# Patient Record
Sex: Female | Born: 1990
Health system: Southern US, Community
[De-identification: ages and names within clinical notes are randomized; demographics above are authoritative.]

---
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
## 2010-04-22 LAB — URINALYSIS, ROUTINE W REFLEX MICROSCOPIC
Glucose, UA: NEGATIVE mg/dL
Hgb urine dipstick: NEGATIVE
Ketones, ur: NEGATIVE mg/dL
Protein, ur: NEGATIVE mg/dL

## 2010-08-09 ENCOUNTER — Encounter: Payer: Self-pay | Admitting: *Deleted

## 2010-08-09 ENCOUNTER — Emergency Department (HOSPITAL_BASED_OUTPATIENT_CLINIC_OR_DEPARTMENT_OTHER)
Admission: EM | Admit: 2010-08-09 | Discharge: 2010-08-09 | Disposition: A | Discharge status: Home / Self Care | Payer: Medicaid Other | Attending: Emergency Medicine | Admitting: Emergency Medicine

## 2010-08-09 DIAGNOSIS — F17200 Nicotine dependence, unspecified, uncomplicated: Secondary | ICD-10-CM | POA: Insufficient documentation

## 2010-08-09 DIAGNOSIS — J029 Acute pharyngitis, unspecified: Secondary | ICD-10-CM | POA: Insufficient documentation

## 2010-08-09 NOTE — ED Notes (Signed)
Patient C/O throat and tongue for the past two days, hurts to swallow, chills at night

## 2010-08-09 NOTE — ED Provider Notes (Signed)
History     Chief Complaint  Patient presents with  . Sore Throat   Patient is a 20 y.o. female presenting with pharyngitis. The history is provided by the patient.  Sore Throat This is a new problem. The current episode started 2 days ago. The problem occurs constantly. The problem has not changed since onset.Pertinent negatives include no chest pain, no abdominal pain, no headaches and no shortness of breath. The symptoms are aggravated by swallowing. The symptoms are relieved by nothing. She has tried nothing for the symptoms.    History reviewed. No pertinent past medical history.  History reviewed. No pertinent past surgical history.  History reviewed. No pertinent family history.  History  Substance Use Topics  . Smoking status: Current Everyday Smoker  . Smokeless tobacco: Not on file  . Alcohol Use: Yes    OB History    Grav Para Term Preterm Abortions TAB SAB Ect Mult Living                  Review of Systems  Constitutional: Negative for fever and fatigue.  HENT: Positive for postnasal drip. Negative for congestion, rhinorrhea, neck pain, neck stiffness, sinus pressure and ear discharge.   Eyes: Negative.   Respiratory: Negative.  Negative for shortness of breath.   Cardiovascular: Negative.  Negative for chest pain.  Gastrointestinal: Negative.  Negative for abdominal pain.  Genitourinary: Negative.   Musculoskeletal: Negative.   Skin: Negative.   Neurological: Negative for headaches.    Physical Exam  BP 118/61  Pulse 65  Temp(Src) 98.5 F (36.9 C) (Oral)  Resp 18  SpO2 100%  Physical Exam  Constitutional: She is oriented to person, place, and time. She appears well-developed and well-nourished.  HENT:  Head: Normocephalic.  Right Ear: External ear normal.  Left Ear: External ear normal.  Nose: Nose normal.  Mouth/Throat: Oropharynx is clear and moist. No oropharyngeal exudate.  Eyes: Pupils are equal, round, and reactive to light.  Neck: No  tracheal deviation present.  Cardiovascular: Normal rate, regular rhythm and normal heart sounds.   Pulmonary/Chest: Effort normal and breath sounds normal.  Abdominal: Soft. Bowel sounds are normal.  Musculoskeletal: Normal range of motion.  Lymphadenopathy:    She has cervical adenopathy.  Neurological: She is alert and oriented to person, place, and time.  Skin: Skin is warm and dry.    ED Course  Procedures Results for orders placed during the hospital encounter of 08/09/10  RAPID STREP SCREEN      Component Value Range   Streptococcus, Group A Screen (Direct) NEGATIVE  NEGATIVE     MDM  Symptoms likely viral, will discuss symptomatic tx, f/u with pmd if no better      Rolan Bucco, MD 08/09/10 1259

## 2012-08-22 ENCOUNTER — Encounter (HOSPITAL_BASED_OUTPATIENT_CLINIC_OR_DEPARTMENT_OTHER): Payer: Self-pay

## 2012-08-22 ENCOUNTER — Emergency Department (HOSPITAL_BASED_OUTPATIENT_CLINIC_OR_DEPARTMENT_OTHER)
Admission: EM | Admit: 2012-08-22 | Discharge: 2012-08-22 | Disposition: A | Discharge status: Home / Self Care | Payer: Medicaid Other | Attending: Emergency Medicine | Admitting: Emergency Medicine

## 2012-08-22 DIAGNOSIS — B9689 Other specified bacterial agents as the cause of diseases classified elsewhere: Secondary | ICD-10-CM | POA: Insufficient documentation

## 2012-08-22 DIAGNOSIS — F17200 Nicotine dependence, unspecified, uncomplicated: Secondary | ICD-10-CM | POA: Insufficient documentation

## 2012-08-22 DIAGNOSIS — R319 Hematuria, unspecified: Secondary | ICD-10-CM | POA: Insufficient documentation

## 2012-08-22 DIAGNOSIS — N760 Acute vaginitis: Secondary | ICD-10-CM

## 2012-08-22 DIAGNOSIS — Z3202 Encounter for pregnancy test, result negative: Secondary | ICD-10-CM | POA: Insufficient documentation

## 2012-08-22 DIAGNOSIS — A499 Bacterial infection, unspecified: Secondary | ICD-10-CM | POA: Insufficient documentation

## 2012-08-22 DIAGNOSIS — N390 Urinary tract infection, site not specified: Secondary | ICD-10-CM

## 2012-08-22 DIAGNOSIS — R21 Rash and other nonspecific skin eruption: Secondary | ICD-10-CM | POA: Insufficient documentation

## 2012-08-22 LAB — GC/CHLAMYDIA PROBE AMP: CT Probe RNA: NEGATIVE

## 2012-08-22 LAB — WET PREP, GENITAL
Trich, Wet Prep: NONE SEEN
Yeast Wet Prep HPF POC: NONE SEEN

## 2012-08-22 LAB — URINALYSIS, ROUTINE W REFLEX MICROSCOPIC
Bilirubin Urine: NEGATIVE
Specific Gravity, Urine: 1.023 (ref 1.005–1.030)
pH: 5 (ref 5.0–8.0)

## 2012-08-22 MED ORDER — NITROFURANTOIN MONOHYD MACRO 100 MG PO CAPS: 100.0000 mg | ORAL_CAPSULE | Freq: Two times a day (BID) | ORAL | Status: AC

## 2012-08-22 MED ORDER — METRONIDAZOLE 500 MG PO TABS: 500.0000 mg | ORAL_TABLET | Freq: Two times a day (BID) | ORAL | Status: DC

## 2012-08-22 NOTE — ED Provider Notes (Addendum)
CSN: [id number]     Arrival date & time 08/22/12  1110 History     First MD Initiated Contact with Patient 08/22/12 1121     Chief Complaint  Patient presents with  . Vaginal Discharge   . Hematuria   (Consider location/radiation/quality/duration/timing/severity/associated sxs/prior Treatment) Patient is a 22 y.o. female presenting with vaginal discharge. The history is provided by the patient.  Vaginal Discharge Quality:  Watery, white, yellow and milky Severity:  Severe Onset quality:  Gradual Duration:  2 weeks Timing:  Constant Progression:  Worsening Chronicity:  New Context: spontaneously   Relieved by:  Nothing Worsened by:  Nothing tried Ineffective treatments:  None tried Associated symptoms: vaginal itching   Associated symptoms: no abdominal pain, no dysuria, no fever, no genital lesions, no nausea and no urinary frequency   Associated symptoms comment:  Pt states she has started to chafe since the d/c started and now some irritation with urinating and occasional blood flecks when wiping.   Risk factors: unprotected sex   Risk factors: no gynecological surgery, no new sexual partner, no PID, no prior miscarriage, no STI and no STI exposure     History reviewed. No pertinent past medical history. History reviewed. No pertinent past surgical history. No family history on file. History  Substance Use Topics  . Smoking status: Current Every Day Smoker  . Smokeless tobacco: Not on file  . Alcohol Use: Yes   OB History   Grav Para Term Preterm Abortions TAB SAB Ect Mult Living                 Review of Systems  Constitutional: Negative for fever.  Gastrointestinal: Negative for nausea and abdominal pain.  Genitourinary: Positive for vaginal discharge. Negative for dysuria.  All other systems reviewed and are negative.    Allergies  Review of patient's allergies indicates no known allergies.  Home Medications  No current outpatient prescriptions on  file. BP 113/66  Pulse 70  Temp(Src) 98.6 F (37 C) (Oral)  Resp 16  Ht 5\' 4"  (1.626 m)  Wt 130 lb (58.968 kg)  BMI 22.3 kg/m2  SpO2 100% Physical Exam  Nursing note and vitals reviewed. Constitutional: She is oriented to person, place, and time. She appears well-developed and well-nourished. No distress.  HENT:  Head: Normocephalic and atraumatic.  Mouth/Throat: Oropharynx is clear and moist.  Eyes: Conjunctivae and EOM are normal. Pupils are equal, round, and reactive to light.  Neck: Normal range of motion. Neck supple.  Cardiovascular: Normal rate and intact distal pulses.   Pulmonary/Chest: Effort normal.  Abdominal: Soft. She exhibits no distension. There is no tenderness. There is no rebound and no guarding.  Genitourinary:    There is rash on the right labia. There is no lesion on the right labia. There is rash on the left labia. There is no lesion on the left labia. Vaginal discharge found.  Musculoskeletal: Normal range of motion. She exhibits no edema and no tenderness.  Neurological: She is alert and oriented to person, place, and time.  Skin: Skin is warm and dry. No rash noted. No erythema.  Psychiatric: She has a normal mood and affect. Her behavior is normal.    ED Course   Procedures (including critical care time)  Labs Reviewed  WET PREP, GENITAL - Abnormal; Notable for the following:    Clue Cells Wet Prep HPF POC FEW (*)    WBC, Wet Prep HPF POC FEW (*)    All other components within  normal limits  URINALYSIS, ROUTINE W REFLEX MICROSCOPIC - Abnormal; Notable for the following:    APPearance CLOUDY (*)    Hgb urine dipstick SMALL (*)    Nitrite POSITIVE (*)    Leukocytes, UA MODERATE (*)    All other components within normal limits  URINE MICROSCOPIC-ADD ON - Abnormal; Notable for the following:    Bacteria, UA MANY (*)    All other components within normal limits  GC/CHLAMYDIA PROBE AMP  PREGNANCY, URINE   No results found. 1. Bacterial  vaginosis   2. UTI (lower urinary tract infection)     MDM   Patient with 2 weeks of worsening vaginal discharge with external vaginal irritation. She denies any dysuria and states she only has one sexual partner unprotected for the last 5 years. She currently has an IUD in last period was approximately several weeks ago. No systemic symptoms. Patient has copious discharge on exam an external excoriation and irritation of the labia. No herpetic lesions present. No findings concerning for PID.  UA, UPT, GC Chlamydia, wet prep pending  12:13 PM Pt with evidence of UTI and BV.  Will treat with flagyl and macrobid.  To f/u with pcp if worsens.  Gwyneth Sprout, MD 08/22/12 1214  Gwyneth Sprout, MD 08/22/12 1215

## 2012-08-22 NOTE — ED Notes (Signed)
Symptoms started 2 weeks with "alot" of while milky non odorous  vaginal discharge, with blood in urine during wiping started yesterday, no dysuria. Had IUD placed 2.70yrs ago. Sexually active with only one partner. ABC intact. VSS.

## 2012-08-24 LAB — URINE CULTURE: Colony Count: >=100000

## 2012-08-26 ENCOUNTER — Telehealth (HOSPITAL_COMMUNITY): Payer: Self-pay | Admitting: Emergency Medicine

## 2012-08-26 NOTE — ED Notes (Signed)
Post ED Visit - Positive Culture Follow-up  Culture report reviewed by antimicrobial stewardship pharmacist: []  Wes Dulaney, Pharm.D., BCPS [x]  Celedonio Miyamoto, Pharm.D., BCPS []  Georgina Pillion, Pharm.D., BCPS []  [place], [address].D., BCPS, AAHIVP []  Estella Husk, Pharm.D., BCPS, AAHIVP  Positive urine culture Treated with Macrobid, organism sensitive to the same and no further patient follow-up is required at this time.  Kylie A Holland 08/26/2012, 11:02 AM

## 2016-09-02 ENCOUNTER — Emergency Department (HOSPITAL_BASED_OUTPATIENT_CLINIC_OR_DEPARTMENT_OTHER)
Admission: EM | Admit: 2016-09-02 | Discharge: 2016-09-02 | Disposition: A | Discharge status: Home / Self Care | Payer: Medicaid Other | Attending: Emergency Medicine | Admitting: Emergency Medicine

## 2016-09-02 ENCOUNTER — Encounter (HOSPITAL_BASED_OUTPATIENT_CLINIC_OR_DEPARTMENT_OTHER): Payer: Self-pay | Admitting: Emergency Medicine

## 2016-09-02 DIAGNOSIS — N760 Acute vaginitis: Secondary | ICD-10-CM | POA: Insufficient documentation

## 2016-09-02 DIAGNOSIS — N390 Urinary tract infection, site not specified: Secondary | ICD-10-CM | POA: Insufficient documentation

## 2016-09-02 DIAGNOSIS — F17210 Nicotine dependence, cigarettes, uncomplicated: Secondary | ICD-10-CM | POA: Insufficient documentation

## 2016-09-02 DIAGNOSIS — B9689 Other specified bacterial agents as the cause of diseases classified elsewhere: Secondary | ICD-10-CM

## 2016-09-02 LAB — URINALYSIS, ROUTINE W REFLEX MICROSCOPIC
BILIRUBIN URINE: NEGATIVE
Glucose, UA: NEGATIVE mg/dL
Hgb urine dipstick: NEGATIVE
KETONES UR: NEGATIVE mg/dL
Nitrite: NEGATIVE
PH: 5.5 (ref 5.0–8.0)
Protein, ur: NEGATIVE mg/dL
SPECIFIC GRAVITY, URINE: 1.022 (ref 1.005–1.030)

## 2016-09-02 LAB — WET PREP, GENITAL
SPERM: NONE SEEN
TRICH WET PREP: NONE SEEN
YEAST WET PREP: NONE SEEN

## 2016-09-02 LAB — PREGNANCY, URINE: PREG TEST UR: NEGATIVE

## 2016-09-02 LAB — URINALYSIS, MICROSCOPIC (REFLEX)

## 2016-09-02 MED ORDER — METRONIDAZOLE 500 MG PO TABS: 500.0000 mg | ORAL_TABLET | Freq: Two times a day (BID) | ORAL | 0 refills | Status: DC

## 2016-09-02 NOTE — ED Triage Notes (Signed)
Patient reports vaginal discharge without odor.  States she also has a foul smell to her urine and believes she has UTI.  Denies dysuria, hematuria.

## 2016-09-02 NOTE — ED Provider Notes (Signed)
[facility]   CSN: [id number] Arrival date & time: 09/02/16  7829     History   Chief Complaint Chief Complaint  Patient presents with  . Vaginal Discharge  . Urinary Tract Infection    HPI Regina Haynes is a 26 y.o. female.  The history is provided by the patient and medical records.  Vaginal Discharge    Urinary Tract Infection       26 year old female with no significant past medical history presenting to the ED with discolored urine and new vaginal discharge. States she feels like her urine has a foul smell. Reports vaginal discharge has been white/clear in color. She denies any pelvic pain, abdominal pain, fever, or rash. No new sexual partner or concern for STD at this time. She denies any dysuria or hematuria. States she has gotten a lot of urinary tract infections in the past.  History reviewed. No pertinent past medical history.  There are no active problems to display for this patient.   History reviewed. No pertinent surgical history.  OB History    No data available       Home Medications    Prior to Admission medications   Medication Sig Start Date End Date Taking? Authorizing Provider  metroNIDAZOLE (FLAGYL) 500 MG tablet Take 1 tablet (500 mg total) by mouth 2 (two) times daily. 08/22/12   Gwyneth Sprout, MD  nitrofurantoin, macrocrystal-monohydrate, (MACROBID) 100 MG capsule Take 1 capsule (100 mg total) by mouth 2 (two) times daily. 08/22/12   Gwyneth Sprout, MD    Family History History reviewed. No pertinent family history.  Social History Social History  Substance Use Topics  . Smoking status: Current Every Day Smoker    Packs/day: 1.00    Types: Cigarettes  . Smokeless tobacco: Never Used  . Alcohol use Yes     Allergies   Patient has no known allergies.   Review of Systems Review of Systems  Genitourinary: Positive for vaginal discharge.  All other systems reviewed and are  negative.    Physical Exam Updated Vital Signs BP 122/85 (BP Location: Right Arm)   Pulse 98   Temp 98.3 F (36.8 C) (Oral)   Resp 18   Ht 5\' 3"  (1.6 m)   Wt 64.4 kg (142 lb)   LMP 08/19/2016 (Approximate)   SpO2 96%   BMI 25.15 kg/m   Physical Exam  Constitutional: She is oriented to person, place, and time. She appears well-developed and well-nourished.  HENT:  Head: Normocephalic and atraumatic.  Mouth/Throat: Oropharynx is clear and moist.  Eyes: Pupils are equal, round, and reactive to light. Conjunctivae and EOM are normal.  Neck: Normal range of motion.  Cardiovascular: Normal rate, regular rhythm and normal heart sounds.   Pulmonary/Chest: Effort normal and breath sounds normal.  Abdominal: Soft. Bowel sounds are normal. There is no tenderness. There is no rigidity, no guarding and no CVA tenderness.  Genitourinary:  Genitourinary Comments: Exam chaperoned by RN Normal female external genitalia without visible lesions or rash, moderate amount of thick, white vaginal discharge present, cervical os is closed, IUD strings are visible, no cervical friability or bleeding, no adnexal or cervical motion tenderness  Musculoskeletal: Normal range of motion.  Neurological: She is alert and oriented to person, place, and time.  Skin: Skin is warm and dry.  Psychiatric: She has a normal mood and affect.  Nursing note and vitals reviewed.    ED Treatments / Results  Labs (all labs ordered are  listed, but only abnormal results are displayed) Labs Reviewed  URINALYSIS, ROUTINE W REFLEX MICROSCOPIC - Abnormal; Notable for the following:       Result Value   APPearance CLOUDY (*)    Leukocytes, UA MODERATE (*)    All other components within normal limits  URINALYSIS, MICROSCOPIC (REFLEX) - Abnormal; Notable for the following:    Bacteria, UA FEW (*)    Squamous Epithelial / LPF 6-30 (*)    All other components within normal limits  WET PREP, GENITAL  PREGNANCY, URINE   GC/CHLAMYDIA PROBE AMP ([place]) NOT AT [facility]    EKG  EKG Interpretation None       Radiology No results found.  Procedures Procedures (including critical care time)  Medications Ordered in ED Medications - No data to display   Initial Impression / Assessment and Plan / ED Course  I have reviewed the triage vital signs and the nursing notes.  Pertinent labs & imaging results that were available during my care of the patient were reviewed by me and considered in my medical decision making (see chart for details).  26 y.o. F here with dark urine and vaginal discharge.  No true urinary symptoms.  Abdominal exam benign, no CVA tenderness.  UA appears contaminated.  Pelvic exam with thick, white vaginal discharge on exam.  No adnexal or CMT.  Wet prep with clue cells noted.  Gc/chl pending.  Will treat with flagyl.  Advised against EtOH consumption while taking this.  Can follow-up with OB-GYN.  Discussed plan with patient, she acknowledged understanding and agreed with plan of care.  Return precautions given for new or worsening symptoms.  Final Clinical Impressions(s) / ED Diagnoses   Final diagnoses:  BV (bacterial vaginosis)    New Prescriptions New Prescriptions   METRONIDAZOLE (FLAGYL) 500 MG TABLET    Take 1 tablet (500 mg total) by mouth 2 (two) times daily.     Garlon Hatchet, PA-C 09/02/16 1110    Melene Plan, DO 09/02/16 1123

## 2016-09-02 NOTE — Discharge Instructions (Signed)
Take the prescribed medication as directed.  Do not drink alcohol while taking this. Follow-up with OB-GYN if you continue having issues. Return to the ED for new or worsening symptoms.

## 2016-09-03 LAB — GC/CHLAMYDIA PROBE AMP (~~LOC~~) NOT AT ARMC
CHLAMYDIA, DNA PROBE: NEGATIVE
NEISSERIA GONORRHEA: NEGATIVE

## 2017-03-14 ENCOUNTER — Emergency Department (HOSPITAL_BASED_OUTPATIENT_CLINIC_OR_DEPARTMENT_OTHER)
Admission: EM | Admit: 2017-03-14 | Discharge: 2017-03-14 | Disposition: A | Discharge status: Home / Self Care | Payer: Self-pay | Attending: Emergency Medicine | Admitting: Emergency Medicine

## 2017-03-14 ENCOUNTER — Other Ambulatory Visit: Payer: Self-pay

## 2017-03-14 DIAGNOSIS — F17210 Nicotine dependence, cigarettes, uncomplicated: Secondary | ICD-10-CM | POA: Insufficient documentation

## 2017-03-14 DIAGNOSIS — N3000 Acute cystitis without hematuria: Secondary | ICD-10-CM | POA: Insufficient documentation

## 2017-03-14 DIAGNOSIS — N760 Acute vaginitis: Secondary | ICD-10-CM | POA: Insufficient documentation

## 2017-03-14 DIAGNOSIS — N898 Other specified noninflammatory disorders of vagina: Secondary | ICD-10-CM

## 2017-03-14 DIAGNOSIS — B9689 Other specified bacterial agents as the cause of diseases classified elsewhere: Secondary | ICD-10-CM | POA: Insufficient documentation

## 2017-03-14 LAB — URINALYSIS, ROUTINE W REFLEX MICROSCOPIC
BILIRUBIN URINE: NEGATIVE
Glucose, UA: NEGATIVE mg/dL
HGB URINE DIPSTICK: NEGATIVE
Ketones, ur: NEGATIVE mg/dL
NITRITE: POSITIVE — AB
PH: 5.5 (ref 5.0–8.0)
Protein, ur: NEGATIVE mg/dL
Specific Gravity, Urine: >1.03 — ABNORMAL HIGH (ref 1.005–1.030)

## 2017-03-14 LAB — WET PREP, GENITAL
Sperm: NONE SEEN
Trich, Wet Prep: NONE SEEN
Yeast Wet Prep HPF POC: NONE SEEN

## 2017-03-14 LAB — URINALYSIS, MICROSCOPIC (REFLEX)

## 2017-03-14 LAB — PREGNANCY, URINE: Preg Test, Ur: NEGATIVE

## 2017-03-14 MED ORDER — METRONIDAZOLE 500 MG PO TABS: 500.0000 mg | ORAL_TABLET | Freq: Two times a day (BID) | ORAL | 0 refills | Status: DC

## 2017-03-14 MED ORDER — CEPHALEXIN 500 MG PO CAPS: 500.0000 mg | ORAL_CAPSULE | Freq: Three times a day (TID) | ORAL | 0 refills | Status: AC

## 2017-03-14 MED FILL — metroNIDAZOLE 500 MG TABS: 500 | 7 days supply | Qty: 14 | Fill #0

## 2017-03-14 MED FILL — CEPHALEXIN 500 MG CAPSULE: 500 | 7 days supply | Qty: 21 | Fill #0

## 2017-03-14 NOTE — ED Triage Notes (Signed)
Pt c/o a cyst on vaginal area and having a discharge

## 2017-03-14 NOTE — ED Provider Notes (Signed)
Emergency Department Provider Note   I have reviewed the triage vital signs and the nursing notes.   HISTORY  Chief Complaint Vaginal Discharge   HPI Regina Haynes is a 27 y.o. female presents to the emergency department for evaluation of mild vaginal discharge with recent ruptured cyst on the outside of the vagina.  The patient has had prior cysts in the past.  She noticed a slightly irregularly-shaped cyst which was unusual 2 days ago.  The cyst ruptured yesterday and produced a white and slightly bloody fluid.  She has 2 additional cysts that she knew about previously that are not significantly bigger.  She denies any fevers or chills.  No specific pain.  She does have history of bacterial vaginosis and is concerned this may be co-occurring.  Denies any dysuria, hesitancy, urgency.   No past medical history on file.  There are no active problems to display for this patient.   No past surgical history on file.  Current Outpatient Rx  . Order #: [id number] Class: Print  . Order #: [id number] Class: Print  . Order #: [id number] Class: Print    Allergies Patient has no known allergies.  No family history on file.  Social History Social History   Tobacco Use  . Smoking status: Current Every Day Smoker    Packs/day: 1.00    Types: Cigarettes  . Smokeless tobacco: Never Used  Substance Use Topics  . Alcohol use: Yes  . Drug use: No    Review of Systems  Constitutional: No fever/chills Eyes: No visual changes. ENT: No sore throat. Cardiovascular: Denies chest pain. Respiratory: Denies shortness of breath. Gastrointestinal: No abdominal pain.  No nausea, no vomiting.  No diarrhea.  No constipation. Genitourinary: Negative for dysuria. Vaginal cysts and mild discharge.  Musculoskeletal: Negative for back pain. Skin: Negative for rash. Neurological: Negative for headaches, focal weakness or numbness.  10-point ROS otherwise  negative.  ____________________________________________   PHYSICAL EXAM:  VITAL SIGNS: ED Triage Vitals  Enc Vitals Group     BP 03/14/17 0833 127/82     Pulse Rate 03/14/17 0833 87     Resp 03/14/17 0833 18     Temp 03/14/17 0833 98 F (36.7 C)     Temp Source 03/14/17 0833 Oral     SpO2 03/14/17 0833 100 %     Weight 03/14/17 0834 130 lb (59 kg)     Height 03/14/17 0834 5\' 4"  (1.626 m)   Constitutional: Alert and oriented. Well appearing and in no acute distress. Eyes: Conjunctivae are normal.  Head: Atraumatic. Nose: No congestion/rhinnorhea. Mouth/Throat: Mucous membranes are moist.   Neck: No stridor.  Cardiovascular:  Good peripheral circulation Respiratory: Normal respiratory effort.  Gastrointestinal:  No distention.  Genitourinary: Pelvic performed with nurse chaperone. Two 0.25 cm cysts on the right labia minora. No cellulitis or fluctuance. Area of tissue just distal to these cysts with no inflammation or drainage. No CMT. No adnexal tenderness or fullness. Mild white vaginal discharge.  Musculoskeletal: No lower extremity tenderness nor edema. No gross deformities of extremities. Neurologic:  Normal speech and language. No gross focal neurologic deficits are appreciated.  Skin:  Skin is warm, dry and intact. No rash noted.  ____________________________________________   LABS (all labs ordered are listed, but only abnormal results are displayed)  Labs Reviewed  WET PREP, GENITAL - Abnormal; Notable for the following components:      Result Value   Clue Cells Wet Prep HPF POC PRESENT (*)  WBC, Wet Prep HPF POC MANY (*)    All other components within normal limits  URINALYSIS, ROUTINE W REFLEX MICROSCOPIC - Abnormal; Notable for the following components:   APPearance CLOUDY (*)    Specific Gravity, Urine >1.030 (*)    Nitrite POSITIVE (*)    Leukocytes, UA MODERATE (*)    All other components within normal limits  URINALYSIS, MICROSCOPIC (REFLEX) -  Abnormal; Notable for the following components:   Bacteria, UA MANY (*)    Squamous Epithelial / LPF 6-30 (*)    All other components within normal limits  PREGNANCY, URINE  GC/CHLAMYDIA PROBE AMP ([place]) [facility]   ____________________________________________  RADIOLOGY  None ____________________________________________   PROCEDURES  Procedure(s) performed:   Procedures  None ____________________________________________   INITIAL IMPRESSION / ASSESSMENT AND PLAN / ED COURSE  Pertinent labs & imaging results that were available during my care of the patient were reviewed by me and considered in my medical decision making (see chart for details).  Patient presents to the emergency department for evaluation of cysts on the vagina.  She has had these in the past.  The 2 remaining cysts are small and are not consistent with abscess.  The cyst distal to this is ruptured with no surrounding cellulitis, fluctuance, or concern for retained fluid or developing abscess.  Pelvic exam is otherwise normal with the exception of some mild white discharge.  Sending for wet prep.  Sending UA and urine pregnancy.  Plan for referral to OB/GYN regarding cyst and capsule removal for definitive treatment. Patient verbalizes understanding of plan.   Wet prep with evidence of BV. UTI on UA. Treating with Flagyl and Keflex. Pregnancy negative. Discussed potential disulfiram reaction to Flagyl. Will f/u with OB regarding cyst mgmt.   At this time, I do not feel there is any life-threatening condition present. I have reviewed and discussed all results (EKG, imaging, lab, urine as appropriate), exam findings with patient. I have reviewed nursing notes and appropriate previous records.  I feel the patient is safe to be discharged home without further emergent workup. Discussed usual and customary return precautions. Patient and family (if present) verbalize understanding and are comfortable with this  plan.  Patient will follow-up with their primary care provider. If they do not have a primary care provider, information for follow-up has been provided to them. All questions have been answered.   ____________________________________________  FINAL CLINICAL IMPRESSION(S) / ED DIAGNOSES  Final diagnoses:  Bacterial vaginosis  Acute cystitis without hematuria  Vaginal discharge    NEW OUTPATIENT MEDICATIONS STARTED DURING THIS VISIT:  Discharge Medication List as of 03/14/2017 10:03 AM    START taking these medications   Details  cephALEXin (KEFLEX) 500 MG capsule Take 1 capsule (500 mg total) by mouth 3 (three) times daily for 7 days., Starting Mon 03/14/2017, Until Mon 03/21/2017, Print        Note:  This document was prepared using Dragon voice recognition software and may include unintentional dictation errors.  Alona BeneJoshua Angeline Trick, MD Emergency Medicine    Khristy Kalan, Arlyss RepressJoshua G, MD 03/14/17 [phone]

## 2017-03-14 NOTE — Discharge Instructions (Signed)

## 2017-03-15 LAB — GC/CHLAMYDIA PROBE AMP (~~LOC~~) NOT AT ARMC
Chlamydia: NEGATIVE
NEISSERIA GONORRHEA: NEGATIVE

## 2017-03-29 ENCOUNTER — Emergency Department (HOSPITAL_BASED_OUTPATIENT_CLINIC_OR_DEPARTMENT_OTHER)
Admission: EM | Admit: 2017-03-29 | Discharge: 2017-03-29 | Disposition: A | Discharge status: Home / Self Care | Payer: Self-pay | Attending: Emergency Medicine | Admitting: Emergency Medicine

## 2017-03-29 ENCOUNTER — Emergency Department (HOSPITAL_BASED_OUTPATIENT_CLINIC_OR_DEPARTMENT_OTHER): Payer: Self-pay

## 2017-03-29 ENCOUNTER — Encounter (HOSPITAL_BASED_OUTPATIENT_CLINIC_OR_DEPARTMENT_OTHER): Payer: Self-pay | Admitting: Emergency Medicine

## 2017-03-29 ENCOUNTER — Other Ambulatory Visit: Payer: Self-pay

## 2017-03-29 DIAGNOSIS — N83202 Unspecified ovarian cyst, left side: Secondary | ICD-10-CM | POA: Insufficient documentation

## 2017-03-29 DIAGNOSIS — N83201 Unspecified ovarian cyst, right side: Secondary | ICD-10-CM | POA: Insufficient documentation

## 2017-03-29 DIAGNOSIS — B9689 Other specified bacterial agents as the cause of diseases classified elsewhere: Secondary | ICD-10-CM | POA: Insufficient documentation

## 2017-03-29 DIAGNOSIS — M5418 Radiculopathy, sacral and sacrococcygeal region: Secondary | ICD-10-CM | POA: Insufficient documentation

## 2017-03-29 DIAGNOSIS — N760 Acute vaginitis: Secondary | ICD-10-CM | POA: Insufficient documentation

## 2017-03-29 DIAGNOSIS — F17210 Nicotine dependence, cigarettes, uncomplicated: Secondary | ICD-10-CM | POA: Insufficient documentation

## 2017-03-29 DIAGNOSIS — B373 Candidiasis of vulva and vagina: Secondary | ICD-10-CM | POA: Insufficient documentation

## 2017-03-29 DIAGNOSIS — B3731 Acute candidiasis of vulva and vagina: Secondary | ICD-10-CM

## 2017-03-29 LAB — PREGNANCY, URINE: PREG TEST UR: NEGATIVE

## 2017-03-29 LAB — WET PREP, GENITAL
SPERM: NONE SEEN
Trich, Wet Prep: NONE SEEN

## 2017-03-29 MED ORDER — METRONIDAZOLE 500 MG PO TABS: 500.0000 mg | ORAL_TABLET | Freq: Two times a day (BID) | ORAL | 0 refills | Status: AC

## 2017-03-29 MED ORDER — FLUCONAZOLE 50 MG PO TABS
150.0000 mg | ORAL_TABLET | Freq: Once | ORAL | Status: AC
  Administered 2017-03-29: 12:00:00 150 mg via ORAL
  Filled 2017-03-29: qty 1

## 2017-03-29 MED ORDER — FLUCONAZOLE 150 MG PO TABS: 150.0000 mg | ORAL_TABLET | Freq: Once | ORAL | 0 refills | Status: AC

## 2017-03-29 MED FILL — metroNIDAZOLE 500 MG TABS: 500 | 7 days supply | Qty: 14 | Fill #0

## 2017-03-29 MED FILL — FLUCONAZOLE 150 MG TABS: 150 | 1 days supply | Qty: 1 | Fill #0

## 2017-03-29 NOTE — ED Notes (Signed)
NAD at this time. Pt is stable and going home.  

## 2017-03-29 NOTE — Discharge Instructions (Signed)
Your wet prep did show signs of bacterial vaginosis.  Have given you an additional medication for this.  Have also given you Diflucan in the ED which is a antifungal medication given that your wet prep did have yeast.  Have also given you additional Diflucan to take in 72 hours if your symptoms do not improve.  I would make sure that you follow-up with an OB/GYN doctor concerning your ovarian cyst.  If you get any fevers, worsening pain, vomiting return the ED for further evaluation sooner.  In terms of your back pain I would useAnti-inflammatories including Motrin or ibuprofen along with heat and ice. Follow-up with a orthopedic doctor if your symptoms do not improve.

## 2017-03-29 NOTE — ED Triage Notes (Signed)
Pt c/o vaginal dc; sts was treated for BV 2 wks ago; sxs cleared up while on abx, but then returned 2 days after completing course

## 2017-03-29 NOTE — ED Provider Notes (Signed)
[facility] EMERGENCY DEPARTMENT Provider Note   CSN: [id number] Arrival date & time: 03/29/17  1007     History   Chief Complaint Chief Complaint  Patient presents with  . Vaginal Discharge    HPI Regina Haynes is a 27 y.o. female.  HPI 27 year old Caucasian female with no pertinent past medical history presents to the emergency department today for evaluation of vaginal discharge.  Patient states that several weeks ago on 3/4 she was treated in the ED for bacterial vaginosis.  She also states that she had a UTI at that time.  She was placed on Flagyl and Keflex.  She states that the discharge improved while she was on antibiotics however 2 days after stopping antibiotics as she had the discharge again.  She reports a white malodorous discharge.  Patient denies any associated pelvic pain, abdominal pain, vaginal itching, nausea, emesis, urinary symptoms, fever.  Patient has not taking for symptoms prior to arrival.  She did look up online that recurring BV can be caused by an IUD.  Patient does not have an OB/GYN doctor that she sees.  Patient also states that she is having some pain in her tailbone region.  States this started 3 weeks ago.  Denies any known injury but states that when she sits down it is very sore.  She has not taken anything for pain.  She has been using a pillow to see that will cushion however this is not helping.  She denies any loss of bowel or bladder, saddle paresthesias, urinary retention, history of IV drug use or cancer.  Again denies any urinary symptoms or flank pain.  Pt denies any fever, chill, ha, vision changes, lightheadedness, dizziness, congestion, neck pain, cp, sob, cough, abd pain, n/v/d, urinary symptoms, change in bowel habits, melena, hematochezia, lower extremity paresthesias.  History reviewed. No pertinent past medical history.  There are no active problems to display for this patient.   History reviewed. No pertinent surgical  history.  OB History    No data available       Home Medications    Prior to Admission medications   Medication Sig Start Date End Date Taking? Authorizing Provider  metroNIDAZOLE (FLAGYL) 500 MG tablet Take 1 tablet (500 mg total) by mouth 2 (two) times daily. 03/14/17   Long, Arlyss RepressJoshua G, MD  nitrofurantoin, macrocrystal-monohydrate, (MACROBID) 100 MG capsule Take 1 capsule (100 mg total) by mouth 2 (two) times daily. 08/22/12   Gwyneth SproutPlunkett, Whitney, MD    Family History No family history on file.  Social History Social History   Tobacco Use  . Smoking status: Current Every Day Smoker    Packs/day: 1.00    Types: Cigarettes  . Smokeless tobacco: Never Used  Substance Use Topics  . Alcohol use: Yes  . Drug use: No     Allergies   Patient has no known allergies.   Review of Systems Review of Systems  All other systems reviewed and are negative.    Physical Exam Updated Vital Signs BP 123/76 (BP Location: Left Arm)   Pulse 91   Temp 98.2 F (36.8 C) (Oral)   Resp 18   Ht 5\' 3"  (1.6 m)   Wt 59 kg (130 lb)   LMP  (LMP Unknown)   SpO2 100%   BMI 23.03 kg/m   Physical Exam  Constitutional: She is oriented to person, place, and time. She appears well-developed and well-nourished.  Non-toxic appearance. No distress.  HENT:  Head: Normocephalic  and atraumatic.  Nose: Nose normal.  Mouth/Throat: Oropharynx is clear and moist.  Eyes: Conjunctivae are normal. Pupils are equal, round, and reactive to light. Right eye exhibits no discharge. Left eye exhibits no discharge.  Neck: Normal range of motion. Neck supple.  Cardiovascular: Normal rate, regular rhythm, normal heart sounds and intact distal pulses.  Pulmonary/Chest: Effort normal and breath sounds normal. No stridor. No respiratory distress. She has no wheezes. She has no rales. She exhibits no tenderness.  Abdominal: Soft. Bowel sounds are normal. There is no tenderness. There is no rigidity, no rebound, no  guarding, no CVA tenderness, no tenderness at McBurney's point and negative Murphy's sign.  Genitourinary:  Genitourinary Comments: Chaperone present for exam. No external lesions, swelling, erythema, or rash of the labia. No erythema, bleeding, or lesions noted in the vaginal vault.  White nonodorous discharge noted.  No CMT tenderness, bleeding or friability. No adnexal tenderness, mass or fullness bilaterally. No inguinal adenopathy or hernia.    Musculoskeletal: Normal range of motion. She exhibits no tenderness.  No midline T spine or L spine tenderness. No deformities or step offs noted. Full ROM. Pelvis is stable.  Lymphadenopathy:    She has no cervical adenopathy.  Neurological: She is alert and oriented to person, place, and time.  Skin: Skin is warm and dry. Capillary refill takes less than 2 seconds.  Psychiatric: Her behavior is normal. Judgment and thought content normal.  Nursing note and vitals reviewed.    ED Treatments / Results  Labs (all labs ordered are listed, but only abnormal results are displayed) Labs Reviewed  WET PREP, GENITAL  PREGNANCY, URINE  GC/CHLAMYDIA PROBE AMP ([place]) NOT AT [facility]    EKG  EKG Interpretation None       Radiology Dg Sacrum/coccyx  Result Date: 03/29/2017 CLINICAL DATA:  Tailbone pain for 2 weeks common no known injury, initial encounter EXAM: SACRUM AND COCCYX - 2+ VIEW COMPARISON:  None. FINDINGS: IUD is noted in place. Sacral ala are within normal limits. Pelvic ring is intact. No acute fracture is noted. IMPRESSION: No acute abnormality noted. Electronically Signed   By: Alcide Clever M.D.   On: 03/29/2017 11:48   US Transvaginal Non-ob  Result Date: 03/29/2017 CLINICAL DATA:  Vaginal discharge. EXAM: TRANSABDOMINAL AND TRANSVAGINAL ULTRASOUND OF PELVIS DOPPLER ULTRASOUND OF OVARIES TECHNIQUE: Study was performed transabdominally to optimize pelvic field of view evaluation and transvaginally to optimize internal visceral  architecture evaluation. Color and duplex Doppler ultrasound was utilized to evaluate blood flow to the ovaries. COMPARISON:  None. FINDINGS: Uterus Measurements: 7.6 x 3.2 x 4.3 cm. No fibroids or other mass visualized. Endometrium Thickness: 2 mm. Intrauterine device is positioned within the endometrium. No focal abnormality visualized. Right ovary Measurements: 5.0 x 3.6 x 4.7 cm. There is a mildly complex cystic area in the right ovary with mild wall thickening in septation measuring 3.8 x 2.6 x 4.1 cm. No other right-sided pelvic lesion evident. Left ovary Measurements: 3.5 x 2.1 x 3.3 cm. There is a somewhat complex mass arising from the left ovary measuring 2.3 x 1.9 x 2.7 cm. No other left-sided pelvic lesion evident. Pulsed Doppler evaluation of both ovaries demonstrates normal low-resistance arterial and venous waveforms. Other findings Small amount of free fluid. IMPRESSION: 1. Mildly complex cystic mass arising from right ovary measuring 3.8 x 2.6 x 4.1 cm. Suspect hemorrhagic ovarian cyst. Short-interval follow up ultrasound in 6-12 weeks is recommended, preferably during the week following the patient's normal menses. 2.  Somewhat complex left ovarian mass measuring 2.3 x 1.9 x 2.7 cm. This lesion also could represent a hemorrhagic ovarian cyst. Endometrioma or possibly focus of infection or differential considerations. This lesion would also benefit from short interval sonographic surveillance. 3.  No apparent torsion on either side. 4.  Small amount of free pelvic fluid. 5. Intrauterine device within the endometrium. Uterus otherwise appears unremarkable. Electronically Signed   By: Bretta Bang III M.D.   On: 03/29/2017 11:55   US Pelvis Complete  Result Date: 03/29/2017 CLINICAL DATA:  Vaginal discharge. EXAM: TRANSABDOMINAL AND TRANSVAGINAL ULTRASOUND OF PELVIS DOPPLER ULTRASOUND OF OVARIES TECHNIQUE: Study was performed transabdominally to optimize pelvic field of view evaluation and  transvaginally to optimize internal visceral architecture evaluation. Color and duplex Doppler ultrasound was utilized to evaluate blood flow to the ovaries. COMPARISON:  None. FINDINGS: Uterus Measurements: 7.6 x 3.2 x 4.3 cm. No fibroids or other mass visualized. Endometrium Thickness: 2 mm. Intrauterine device is positioned within the endometrium. No focal abnormality visualized. Right ovary Measurements: 5.0 x 3.6 x 4.7 cm. There is a mildly complex cystic area in the right ovary with mild wall thickening in septation measuring 3.8 x 2.6 x 4.1 cm. No other right-sided pelvic lesion evident. Left ovary Measurements: 3.5 x 2.1 x 3.3 cm. There is a somewhat complex mass arising from the left ovary measuring 2.3 x 1.9 x 2.7 cm. No other left-sided pelvic lesion evident. Pulsed Doppler evaluation of both ovaries demonstrates normal low-resistance arterial and venous waveforms. Other findings Small amount of free fluid. IMPRESSION: 1. Mildly complex cystic mass arising from right ovary measuring 3.8 x 2.6 x 4.1 cm. Suspect hemorrhagic ovarian cyst. Short-interval follow up ultrasound in 6-12 weeks is recommended, preferably during the week following the patient's normal menses. 2. Somewhat complex left ovarian mass measuring 2.3 x 1.9 x 2.7 cm. This lesion also could represent a hemorrhagic ovarian cyst. Endometrioma or possibly focus of infection or differential considerations. This lesion would also benefit from short interval sonographic surveillance. 3.  No apparent torsion on either side. 4.  Small amount of free pelvic fluid. 5. Intrauterine device within the endometrium. Uterus otherwise appears unremarkable. Electronically Signed   By: Bretta Bang III M.D.   On: 03/29/2017 11:55   [place] Art/ven Flow Abd Pelv Doppler  Result Date: 03/29/2017 CLINICAL DATA:  Vaginal discharge. EXAM: TRANSABDOMINAL AND TRANSVAGINAL ULTRASOUND OF PELVIS DOPPLER ULTRASOUND OF OVARIES TECHNIQUE: Study was performed  transabdominally to optimize pelvic field of view evaluation and transvaginally to optimize internal visceral architecture evaluation. Color and duplex Doppler ultrasound was utilized to evaluate blood flow to the ovaries. COMPARISON:  None. FINDINGS: Uterus Measurements: 7.6 x 3.2 x 4.3 cm. No fibroids or other mass visualized. Endometrium Thickness: 2 mm. Intrauterine device is positioned within the endometrium. No focal abnormality visualized. Right ovary Measurements: 5.0 x 3.6 x 4.7 cm. There is a mildly complex cystic area in the right ovary with mild wall thickening in septation measuring 3.8 x 2.6 x 4.1 cm. No other right-sided pelvic lesion evident. Left ovary Measurements: 3.5 x 2.1 x 3.3 cm. There is a somewhat complex mass arising from the left ovary measuring 2.3 x 1.9 x 2.7 cm. No other left-sided pelvic lesion evident. Pulsed Doppler evaluation of both ovaries demonstrates normal low-resistance arterial and venous waveforms. Other findings Small amount of free fluid. IMPRESSION: 1. Mildly complex cystic mass arising from right ovary measuring 3.8 x 2.6 x 4.1 cm. Suspect hemorrhagic ovarian cyst. Short-interval follow up ultrasound  in 6-12 weeks is recommended, preferably during the week following the patient's normal menses. 2. Somewhat complex left ovarian mass measuring 2.3 x 1.9 x 2.7 cm. This lesion also could represent a hemorrhagic ovarian cyst. Endometrioma or possibly focus of infection or differential considerations. This lesion would also benefit from short interval sonographic surveillance. 3.  No apparent torsion on either side. 4.  Small amount of free pelvic fluid. 5. Intrauterine device within the endometrium. Uterus otherwise appears unremarkable. Electronically Signed   By: Bretta Bang III M.D.   On: 03/29/2017 11:55    Procedures Procedures (including critical care time)  Medications Ordered in ED Medications - No data to display   Initial Impression / Assessment and  Plan / ED Course  I have reviewed the triage vital signs and the nursing notes.  Pertinent labs & imaging results that were available during my care of the patient were reviewed by me and considered in my medical decision making (see chart for details).     Patient presents to the emergency department today for evaluation of painless vaginal discharge.  Treated for BV 2 weeks ago and states that her symptoms improve well antibiotics but then returned.  At that time patient had negative gonorrhea and chlamydia test.  Patient also reports some tailbone pain that is been ongoing for the past month.  No known injury.  Denies any red flag symptoms that be concerning for cauda equina.  She is overall well-appearing and nontoxic.  Vital signs are reassuring.  Patient is afebrile.  Focal abdominal tenderness to palpation.  Pelvic exam reveals some white discharge but no cervical motion tenderness or adnexal tenderness.  Wet prep does reveal yeast and clue cells consistent with her recent antibiotic use.  Patient always has clue cells on her wet prep.  Patient is concerned that her IUD might be causing the bacterial vaginosis.  Will treat with Diflucan and Flagyl.  I have also discussed the patient that she needs to follow-up with an OB/GYN doctor.  From ultrasound to make sure that her IUD was in place.  Ultrasound shows complex ovarian cyst likely representing hemorrhagic cyst on bilateral ovaries.  Differential for the left ovarian cyst includes endometrioma versus focal area of infection.  Patient has no abdominal pain.  No vomiting or fevers.  Patient does report history of bilateral hemorrhagic ovarian cyst.  I do not feel that patient needs antibiotics or further workup for this at this time however I have discussed with her that she needs to follow-up with OB/GYN doctor within the next 1-2 weeks.  Have given her referral.  I discussed that if she develops any fevers, abdominal pain, pelvic pain,  different vaginal discharge return the ED immediately for further evaluation.  Patient had a negative gonorrhea and Chlamydia testing 1 week ago.  Have repeated however do not feel that patient needs treatment at this time.  Low suspicion for STD.  Low suspicion for PID.  Negative pregnancy test.  X-ray of her sacrum and coccyx was obtained that showed no acute abnormality's.  Discussed NSAIDs, ice and follow-up with orthopedics if symptoms persist.  Again no signs or symptoms consistent with cauda equina.  Pt is hemodynamically stable, in NAD, & able to ambulate in the ED. Evaluation does not show pathology that would require ongoing emergent intervention or inpatient treatment. I explained the diagnosis to the patient. Pain has been managed & has no complaints prior to dc. Pt is comfortable with above plan and is stable  for discharge at this time. All questions were answered prior to disposition. Strict return precautions for f/u to the ED were discussed. Encouraged follow up with PCP.   Final Clinical Impressions(s) / ED Diagnoses   Final diagnoses:  BV (bacterial vaginosis)  Vaginal candidiasis  Cysts of both ovaries  Radicular pain of sacrum    ED Discharge Orders        Ordered    metroNIDAZOLE (FLAGYL) 500 MG tablet  2 times daily     03/29/17 1245    fluconazole (DIFLUCAN) 150 MG tablet   Once     03/29/17 1245       Wallace Keller 03/29/17 1251    Cardama, Amadeo Garnet, MD 03/29/17 [phone]

## 2017-03-30 LAB — GC/CHLAMYDIA PROBE AMP (~~LOC~~) NOT AT ARMC
CHLAMYDIA, DNA PROBE: NEGATIVE
Neisseria Gonorrhea: NEGATIVE

## 2018-01-15 ENCOUNTER — Encounter (HOSPITAL_BASED_OUTPATIENT_CLINIC_OR_DEPARTMENT_OTHER): Payer: Self-pay | Admitting: Emergency Medicine

## 2018-01-15 ENCOUNTER — Other Ambulatory Visit: Payer: Self-pay

## 2018-01-15 ENCOUNTER — Emergency Department (HOSPITAL_BASED_OUTPATIENT_CLINIC_OR_DEPARTMENT_OTHER)
Admission: EM | Admit: 2018-01-15 | Discharge: 2018-01-15 | Disposition: A | Discharge status: Home / Self Care | Payer: BLUE CROSS/BLUE SHIELD | Attending: Emergency Medicine | Admitting: Emergency Medicine

## 2018-01-15 DIAGNOSIS — F17210 Nicotine dependence, cigarettes, uncomplicated: Secondary | ICD-10-CM | POA: Diagnosis not present

## 2018-01-15 DIAGNOSIS — N390 Urinary tract infection, site not specified: Secondary | ICD-10-CM

## 2018-01-15 DIAGNOSIS — M549 Dorsalgia, unspecified: Secondary | ICD-10-CM | POA: Diagnosis present

## 2018-01-15 LAB — URINALYSIS, MICROSCOPIC (REFLEX)

## 2018-01-15 LAB — URINALYSIS, ROUTINE W REFLEX MICROSCOPIC
BILIRUBIN URINE: NEGATIVE
Glucose, UA: NEGATIVE mg/dL
KETONES UR: NEGATIVE mg/dL
NITRITE: NEGATIVE
Protein, ur: NEGATIVE mg/dL
Specific Gravity, Urine: 1.025 (ref 1.005–1.030)
pH: 5.5 (ref 5.0–8.0)

## 2018-01-15 LAB — PREGNANCY, URINE: PREG TEST UR: NEGATIVE

## 2018-01-15 MED ORDER — CEPHALEXIN 500 MG PO CAPS: 500.0000 mg | ORAL_CAPSULE | Freq: Two times a day (BID) | ORAL | 0 refills | Status: AC

## 2018-01-15 MED ORDER — KETOROLAC TROMETHAMINE 30 MG/ML IJ SOLN
30.0000 mg | Freq: Once | INTRAMUSCULAR | Status: AC
  Administered 2018-01-15: 30 mg via INTRAVENOUS
  Filled 2018-01-15: qty 1

## 2018-01-15 MED ORDER — SODIUM CHLORIDE 0.9 % IV SOLN
1.0000 g | Freq: Once | INTRAVENOUS | Status: AC
  Administered 2018-01-15: 1 g via INTRAVENOUS
  Filled 2018-01-15: qty 10

## 2018-01-15 MED ORDER — SODIUM CHLORIDE 0.9 % IV BOLUS
500.0000 mL | Freq: Once | INTRAVENOUS | Status: AC
  Administered 2018-01-15: 500 mL via INTRAVENOUS

## 2018-01-15 NOTE — ED Triage Notes (Signed)
Low back pain x 1 week. Denies injury. Endorses urinary frequency.

## 2018-01-15 NOTE — ED Provider Notes (Signed)
[facility] EMERGENCY DEPARTMENT Provider Note   CSN: [id number] Arrival date & time: 01/15/18  1239     History   Chief Complaint Chief Complaint  Patient presents with  . Back Pain    HPI EYMI CORRIVEAU is a 28 y.o. female.  Patient is a 28 year old female who presents with a one-week history of back pain.  She states it started on the right side of her neck but now it is on the left side of her neck and radiates all the way down across her lower back.  She says it is worse with movement.  Occasionally she has some pain to her lower abdomen but denies any now.  She is had some urinary frequency but no other urinary symptoms.  No nausea or vomiting.  No other myalgias.  No significant headache.  No runny nose nasal congestion or coughing.  No rashes.  She reports a fever for the last 2 days but denies any today.     History reviewed. No pertinent past medical history.  There are no active problems to display for this patient.   History reviewed. No pertinent surgical history.   OB History    Gravida  2   Para  2   Term      Preterm      AB      Living  2     SAB      TAB      Ectopic      Multiple      Live Births               Home Medications    Prior to Admission medications   Medication Sig Start Date End Date Taking? Authorizing Provider  cephALEXin (KEFLEX) 500 MG capsule Take 1 capsule (500 mg total) by mouth 2 (two) times daily. 01/15/18   Rolan Bucco, MD  metroNIDAZOLE (FLAGYL) 500 MG tablet Take 1 tablet (500 mg total) by mouth 2 (two) times daily. 03/29/17   Rise Mu, PA-C  nitrofurantoin, macrocrystal-monohydrate, (MACROBID) 100 MG capsule Take 1 capsule (100 mg total) by mouth 2 (two) times daily. 08/22/12   Gwyneth Sprout, MD    Family History No family history on file.  Social History Social History   Tobacco Use  . Smoking status: Current Every Day Smoker    Packs/day: 1.00    Types: Cigarettes  .  Smokeless tobacco: Never Used  Substance Use Topics  . Alcohol use: Yes  . Drug use: No     Allergies   Patient has no known allergies.   Review of Systems Review of Systems  Constitutional: Positive for fatigue and fever. Negative for chills and diaphoresis.  HENT: Negative for congestion, rhinorrhea and sneezing.   Eyes: Negative.   Respiratory: Negative for cough, chest tightness and shortness of breath.   Cardiovascular: Negative for chest pain and leg swelling.  Gastrointestinal: Negative for abdominal pain, blood in stool, diarrhea, nausea and vomiting.  Genitourinary: Negative for difficulty urinating, flank pain, frequency and hematuria.  Musculoskeletal: Positive for back pain and neck pain. Negative for arthralgias.  Skin: Negative for rash.  Neurological: Negative for dizziness, speech difficulty, weakness, numbness and headaches.     Physical Exam Updated Vital Signs BP 121/65 (BP Location: Right Arm)   Pulse (!) 110   Temp 99 F (37.2 C) (Oral)   Resp 20   Ht 5\' 3"  (1.6 m)   Wt 59 kg   LMP 01/07/2018  SpO2 99%   BMI 23.03 kg/m   Physical Exam Constitutional:      Appearance: She is well-developed.  HENT:     Head: Normocephalic and atraumatic.  Eyes:     Pupils: Pupils are equal, round, and reactive to light.  Neck:     Musculoskeletal: Normal range of motion and neck supple.     Comments: No meningismus, patient has some tenderness along the musculature in the left trapezius Cardiovascular:     Rate and Rhythm: Normal rate and regular rhythm.     Heart sounds: Normal heart sounds.  Pulmonary:     Effort: Pulmonary effort is normal. No respiratory distress.     Breath sounds: Normal breath sounds. No wheezing or rales.  Chest:     Chest wall: No tenderness.  Abdominal:     General: Bowel sounds are normal.     Palpations: Abdomen is soft.     Tenderness: There is no abdominal tenderness. There is no guarding or rebound.  Musculoskeletal:  Normal range of motion.     Comments: Mild tenderness in the musculature across the lower back bilaterally, no spinal tenderness  Lymphadenopathy:     Cervical: No cervical adenopathy.  Skin:    General: Skin is warm and dry.     Findings: No rash.  Neurological:     Mental Status: She is alert and oriented to person, place, and time.      ED Treatments / Results  Labs (all labs ordered are listed, but only abnormal results are displayed) Labs Reviewed  URINALYSIS, ROUTINE W REFLEX MICROSCOPIC - Abnormal; Notable for the following components:      Result Value   Hgb urine dipstick MODERATE (*)    Leukocytes, UA TRACE (*)    All other components within normal limits  URINALYSIS, MICROSCOPIC (REFLEX) - Abnormal; Notable for the following components:   Bacteria, UA MANY (*)    All other components within normal limits  URINE CULTURE  PREGNANCY, URINE    EKG None  Radiology No results found.  Procedures Procedures (including critical care time)  Medications Ordered in ED Medications  ketorolac (TORADOL) 30 MG/ML injection 30 mg (30 mg Intravenous Given 01/15/18 1347)  sodium chloride 0.9 % bolus 500 mL (500 mLs Intravenous New Bag/Given 01/15/18 1343)  cefTRIAXone (ROCEPHIN) 1 g in sodium chloride 0.9 % 100 mL IVPB (1 g Intravenous New Bag/Given 01/15/18 1359)     Initial Impression / Assessment and Plan / ED Course  I have reviewed the triage vital signs and the nursing notes.  Pertinent labs & imaging results that were available during my care of the patient were reviewed by me and considered in my medical decision making (see chart for details).     Patient is a 28 year old female who presents with back pain and urinary frequency.  She reported fever yesterday but none today. Her urine looks to be consistent with infection.  She was given IV Rocephin.  Her urine was sent for culture.  She does have some blood in her urine but she does not have symptoms that would  otherwise be more concerning for renal colic.  She does not have unilateral back pain.  There is no associated abdominal pain currently.  Her symptoms do not really seem colicky in nature.  She was discharged home in good condition.  She was encouraged to follow-up with the PCP.  She was advised to return here if she has any worsening symptoms. Final Clinical Impressions(s) /  ED Diagnoses   Final diagnoses:  Lower urinary tract infectious disease    ED Discharge Orders         Ordered    cephALEXin (KEFLEX) 500 MG capsule  2 times daily     01/15/18 1501           Rolan BuccoBelfi, Karolyn Messing, MD 01/15/18 1503

## 2018-01-17 LAB — URINE CULTURE: Culture: <10000 — AB

## 2018-11-20 IMAGING — US US TRANSVAGINAL NON-OB
1 series · 13 of 25 positions shown · non-contrast
Comparison: None.

CLINICAL DATA: Vaginal discharge.

EXAM:
TRANSABDOMINAL AND TRANSVAGINAL ULTRASOUND OF PELVIS
DOPPLER ULTRASOUND OF OVARIES
TECHNIQUE: Study was performed transabdominally to optimize pelvic field of
view evaluation and transvaginally to optimize internal visceral
architecture evaluation. Color and duplex Doppler ultrasound was
utilized to evaluate blood flow to the ovaries.

[Series 1: us transvaginal non-ob · 0.20mm/px · 13 of 149 slices shown]
[im 1/149]
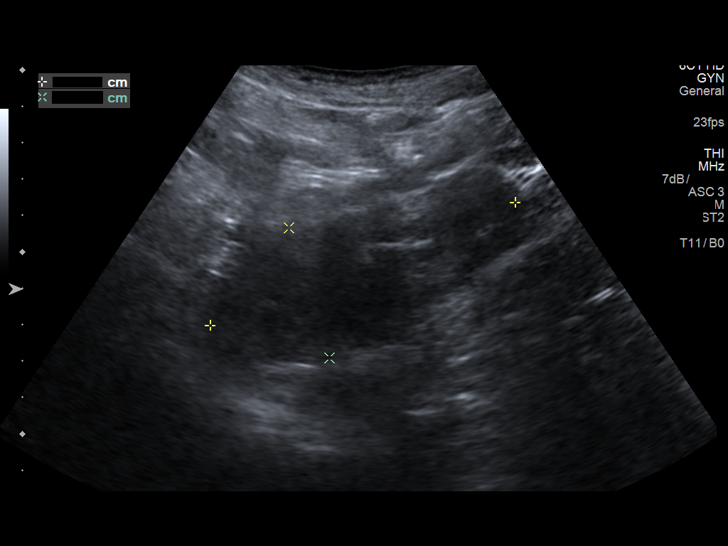
[im 13/149]
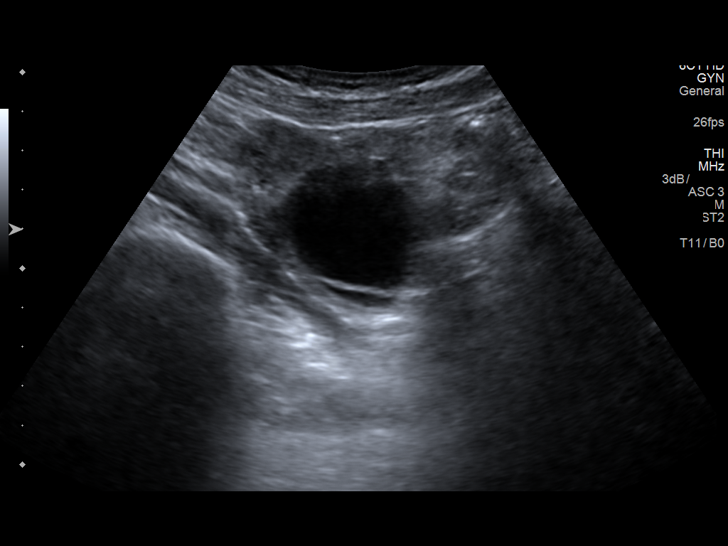
[im 25/149]
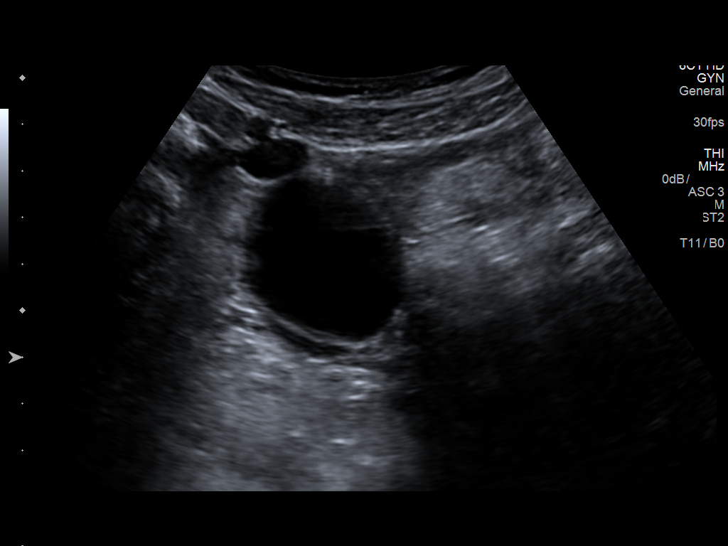
[im 38/149]
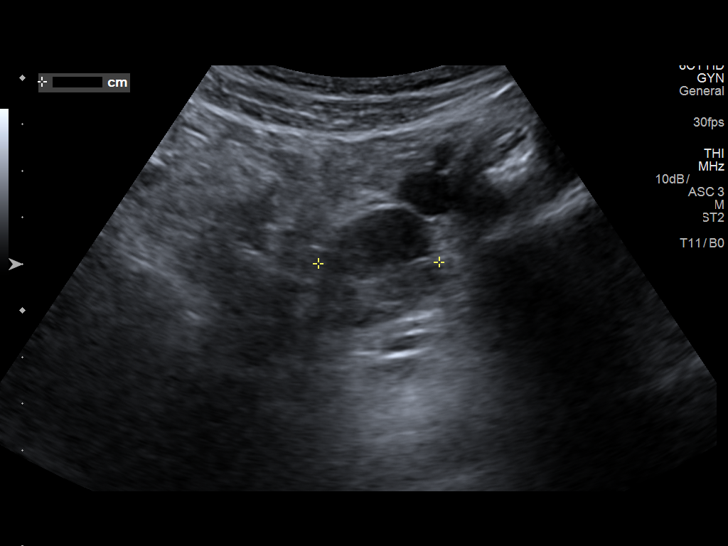
[im 50/149]
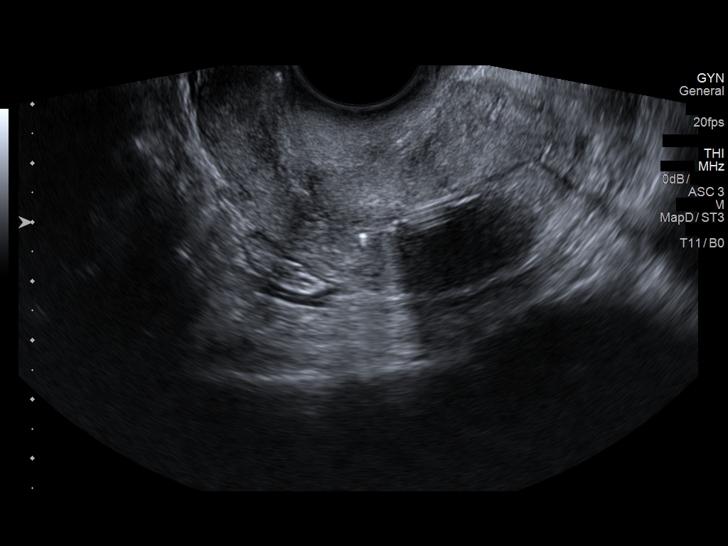
[im 62/149]
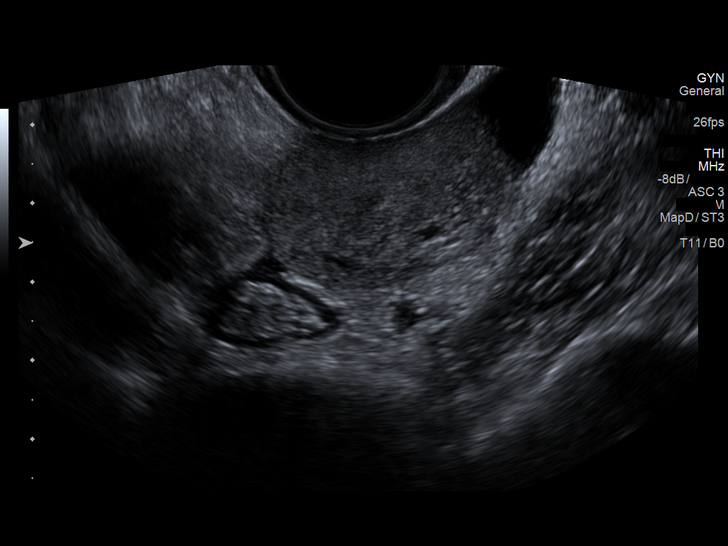
[im 75/149]
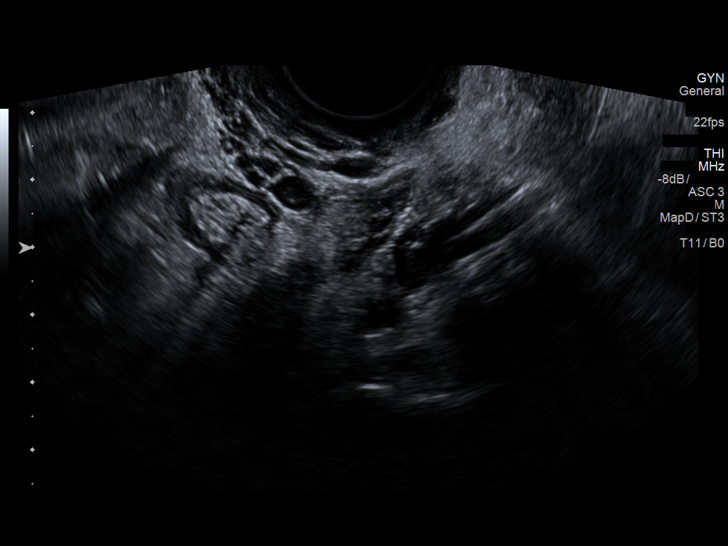
[im 87/149]
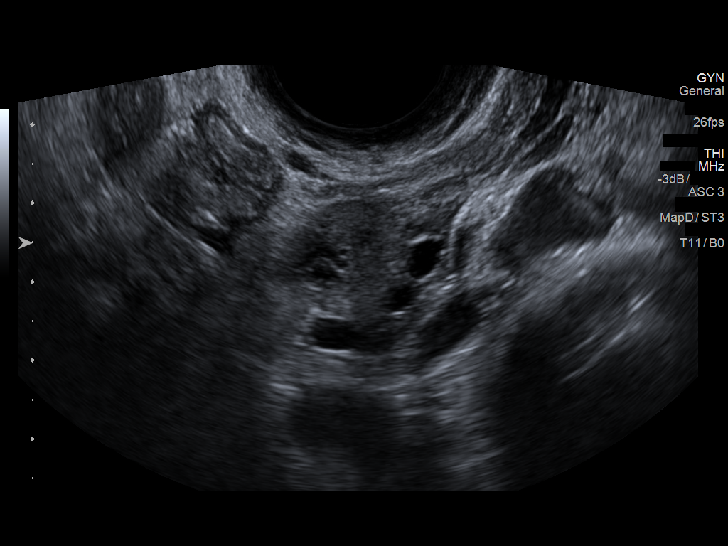
[im 99/149]
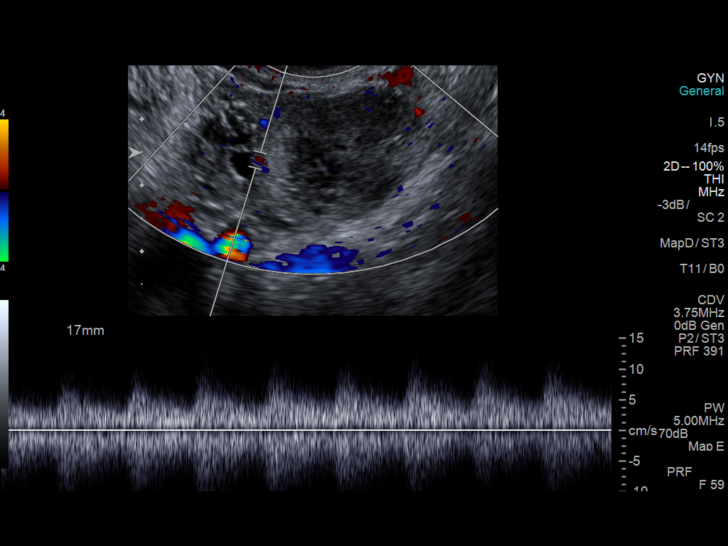
[im 112/149]
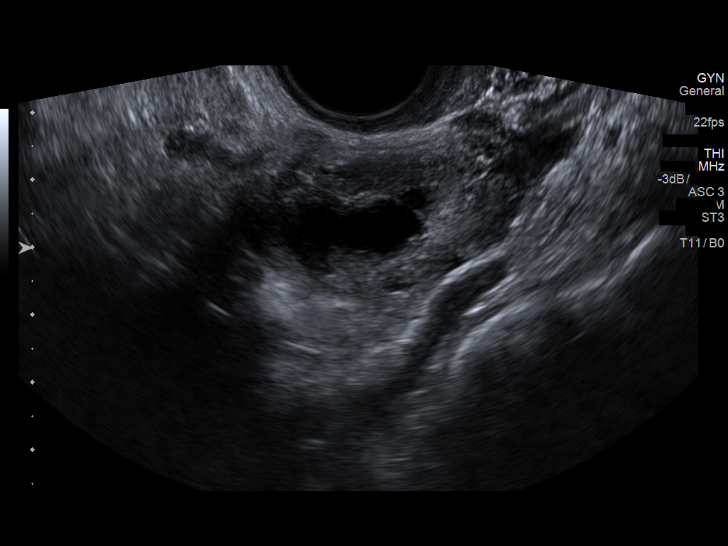
[im 124/149]
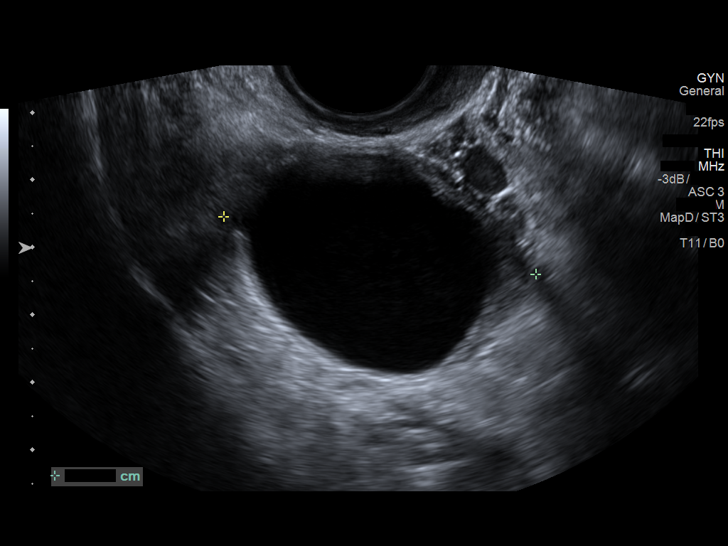
[im 136/149]
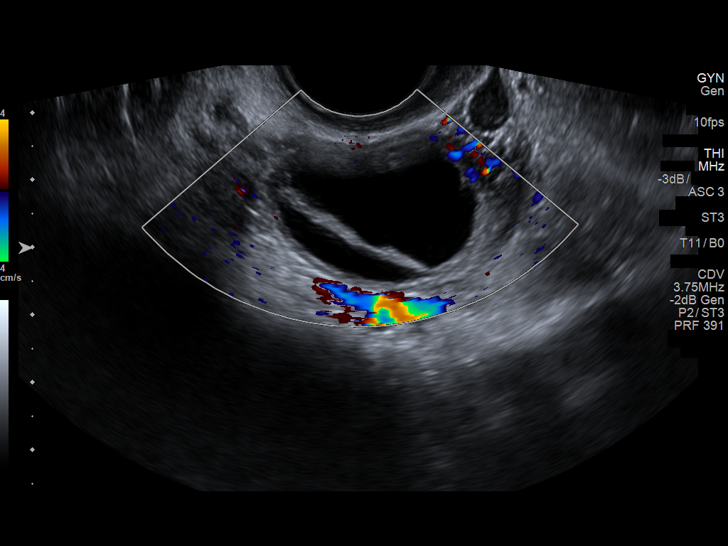
[im 149/149]
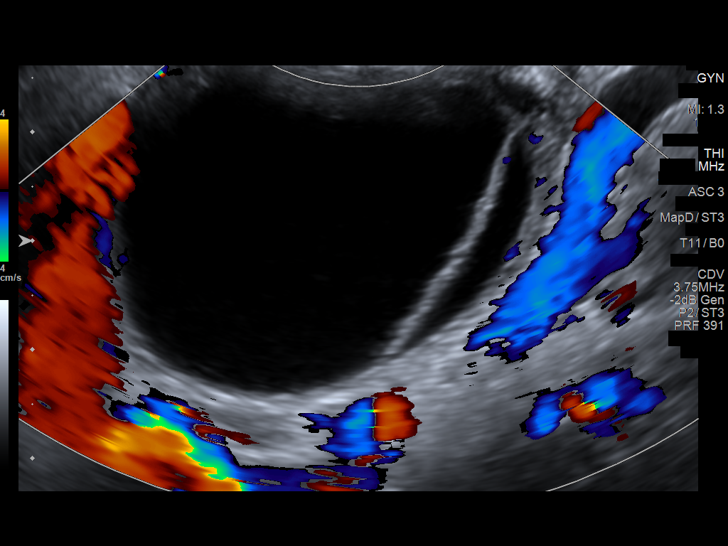

[13 of 25 positions shown; findings below may reference images not displayed]

FINDINGS: Uterus

Measurements: 7.6 x 3.2 x 4.3 cm. No fibroids or other mass
visualized.

Endometrium

Thickness: 2 mm. Intrauterine device is positioned within the
endometrium. No focal abnormality visualized.

Right ovary

Measurements: 5.0 x 3.6 x 4.7 cm. There is a mildly complex cystic
area in the right ovary with mild wall thickening in septation
measuring 3.8 x 2.6 x 4.1 cm. No other right-sided pelvic lesion
evident.

Left ovary

Measurements: 3.5 x 2.1 x 3.3 cm. There is a somewhat complex mass
arising from the left ovary measuring 2.3 x 1.9 x 2.7 cm. No other
left-sided pelvic lesion evident.

Pulsed Doppler evaluation of both ovaries demonstrates normal
low-resistance arterial and venous waveforms.

Other findings

Small amount of free fluid.
IMPRESSION: 1. Mildly complex cystic mass arising from right ovary measuring
x 2.6 x 4.1 cm. Suspect hemorrhagic ovarian cyst.

Short-interval follow up ultrasound in 6-12 weeks is recommended,
preferably during the week following the patient's normal menses.

2. Somewhat complex left ovarian mass measuring 2.3 x 1.9 x 2.7 cm.
This lesion also could represent a hemorrhagic ovarian cyst.
Endometrioma or possibly focus of infection or differential
considerations. This lesion would also benefit from short interval
sonographic surveillance.

3.  No apparent torsion on either side.

4.  Small amount of free pelvic fluid.

5. Intrauterine device within the endometrium. Uterus otherwise
appears unremarkable.

## 2019-02-14 IMAGING — CR DG SACRUM/COCCYX 2+V
3 series · 3 of 3 positions shown · non-contrast
Comparison: None.

CLINICAL DATA: Tailbone pain for 2 weeks common no known injury,
initial encounter

EXAM:
SACRUM AND COCCYX - 2+ VIEW

[t sacrum a.p.]
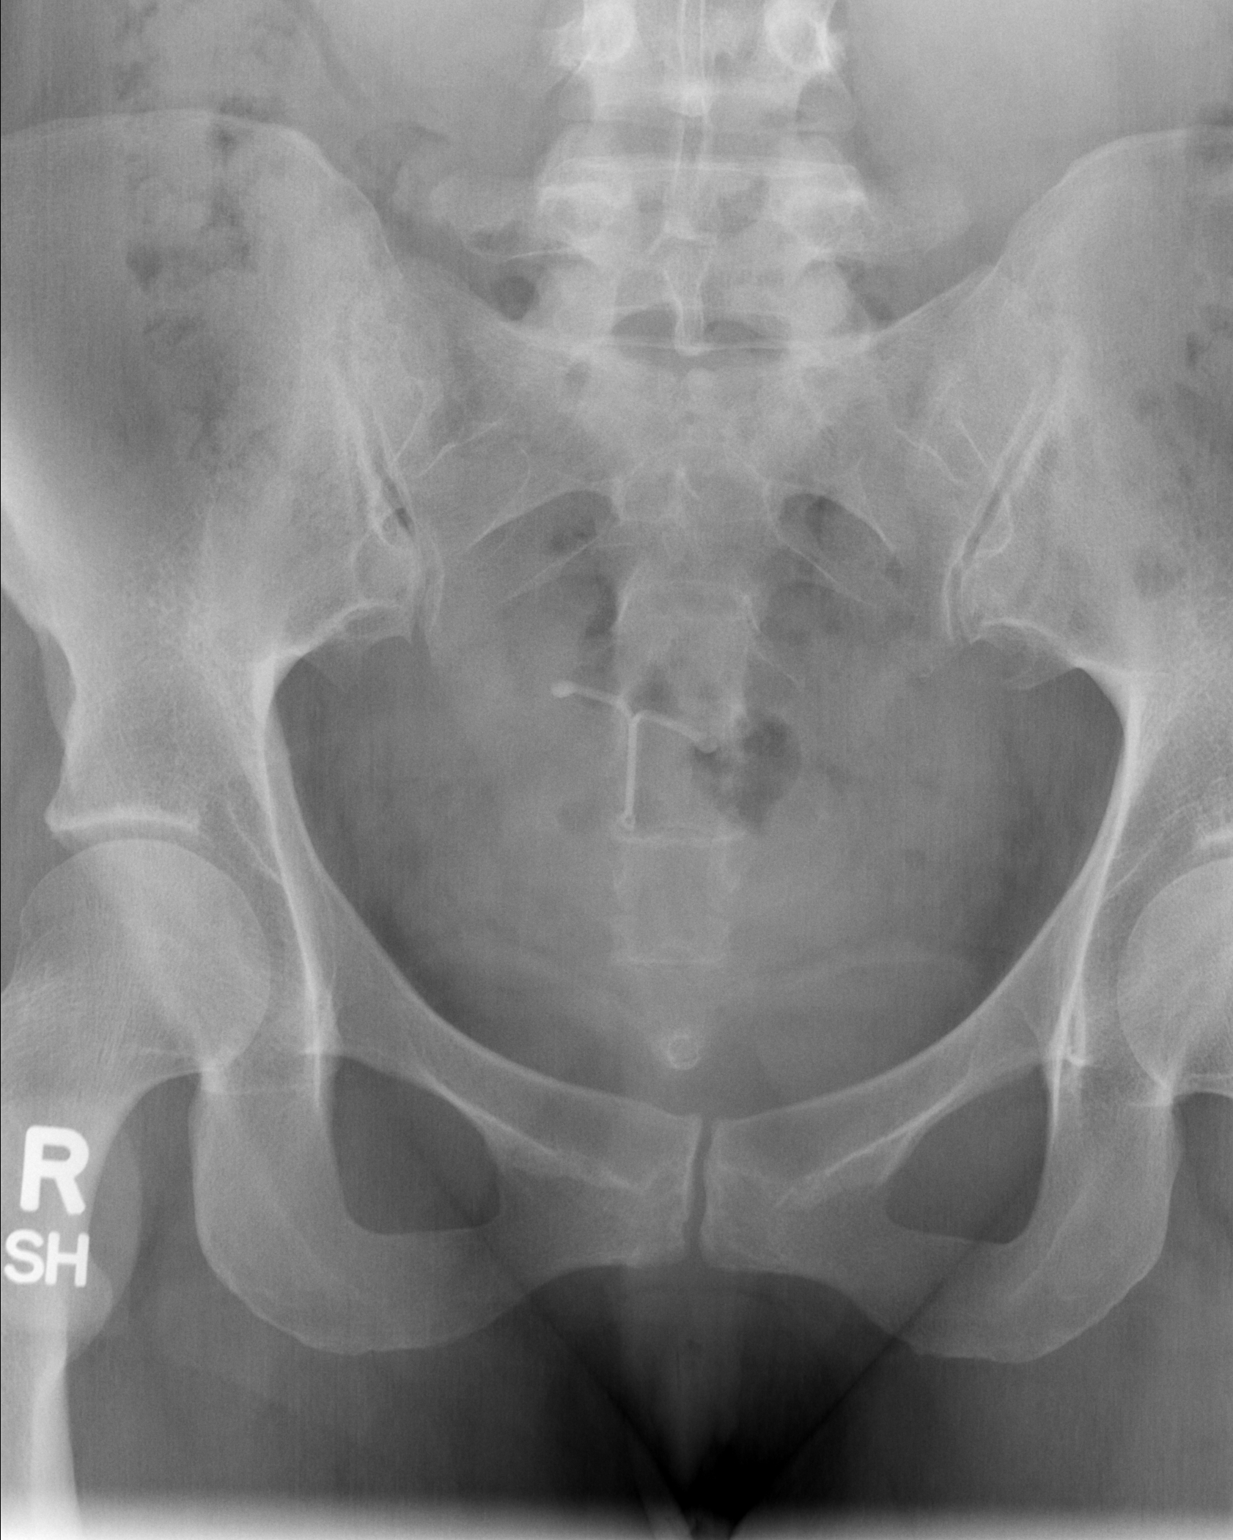

[t sacrum lat (1 of 2)]
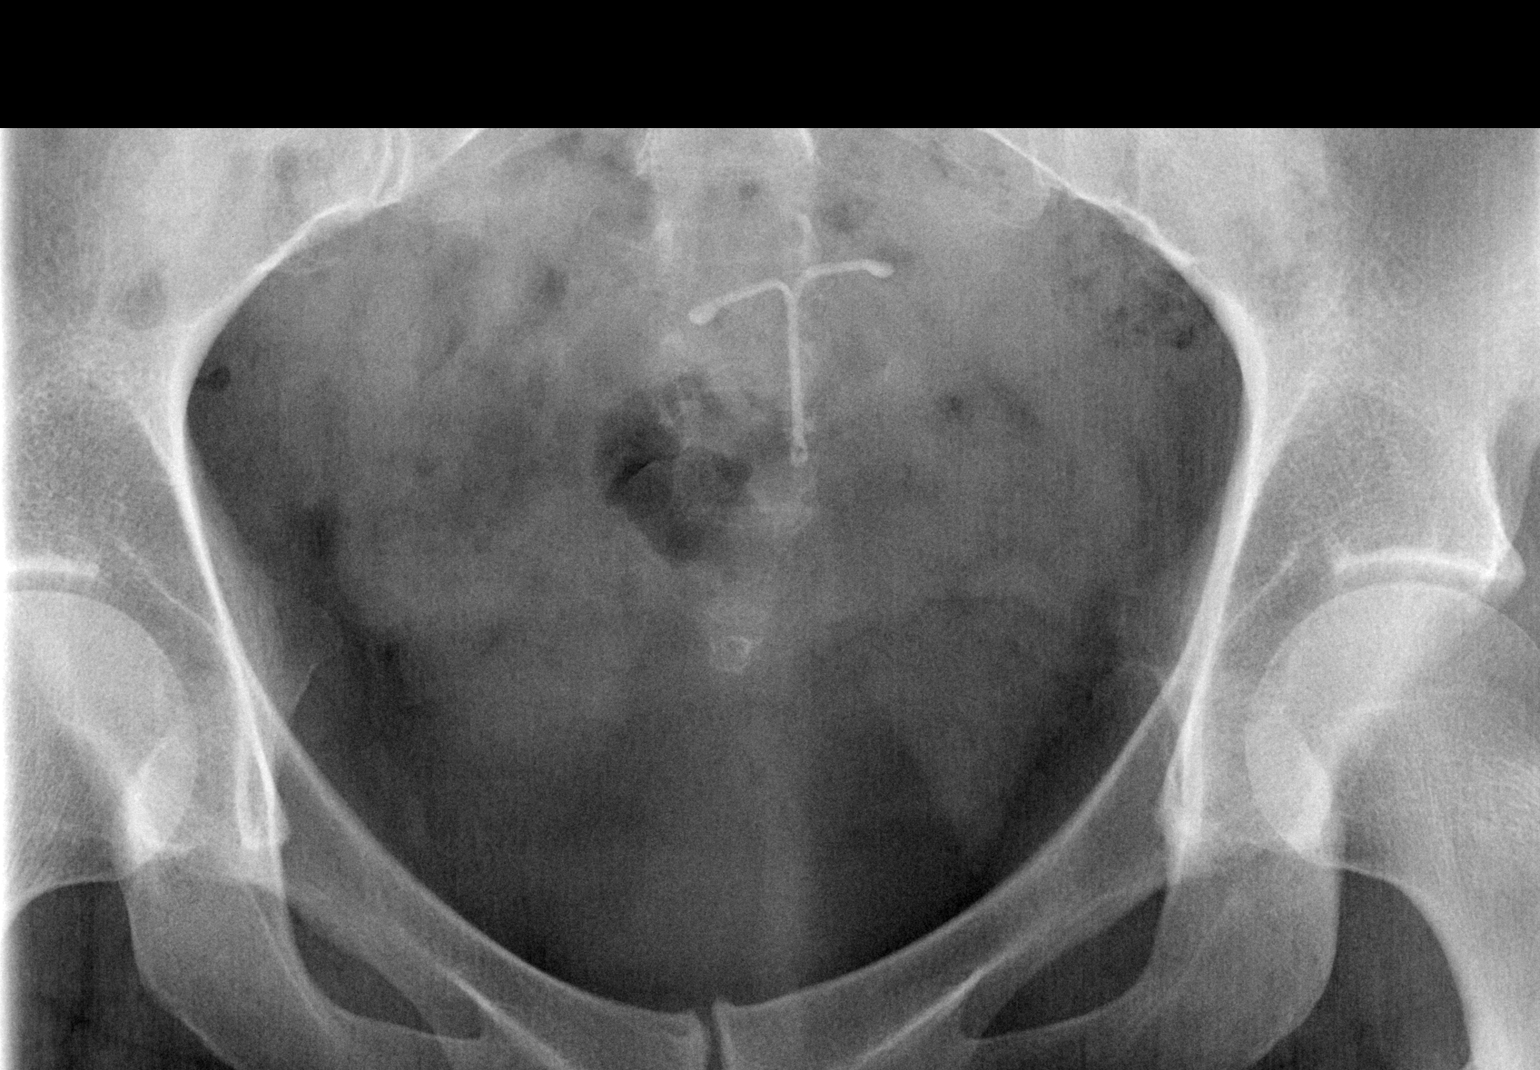

[t sacrum lat (2 of 2)]
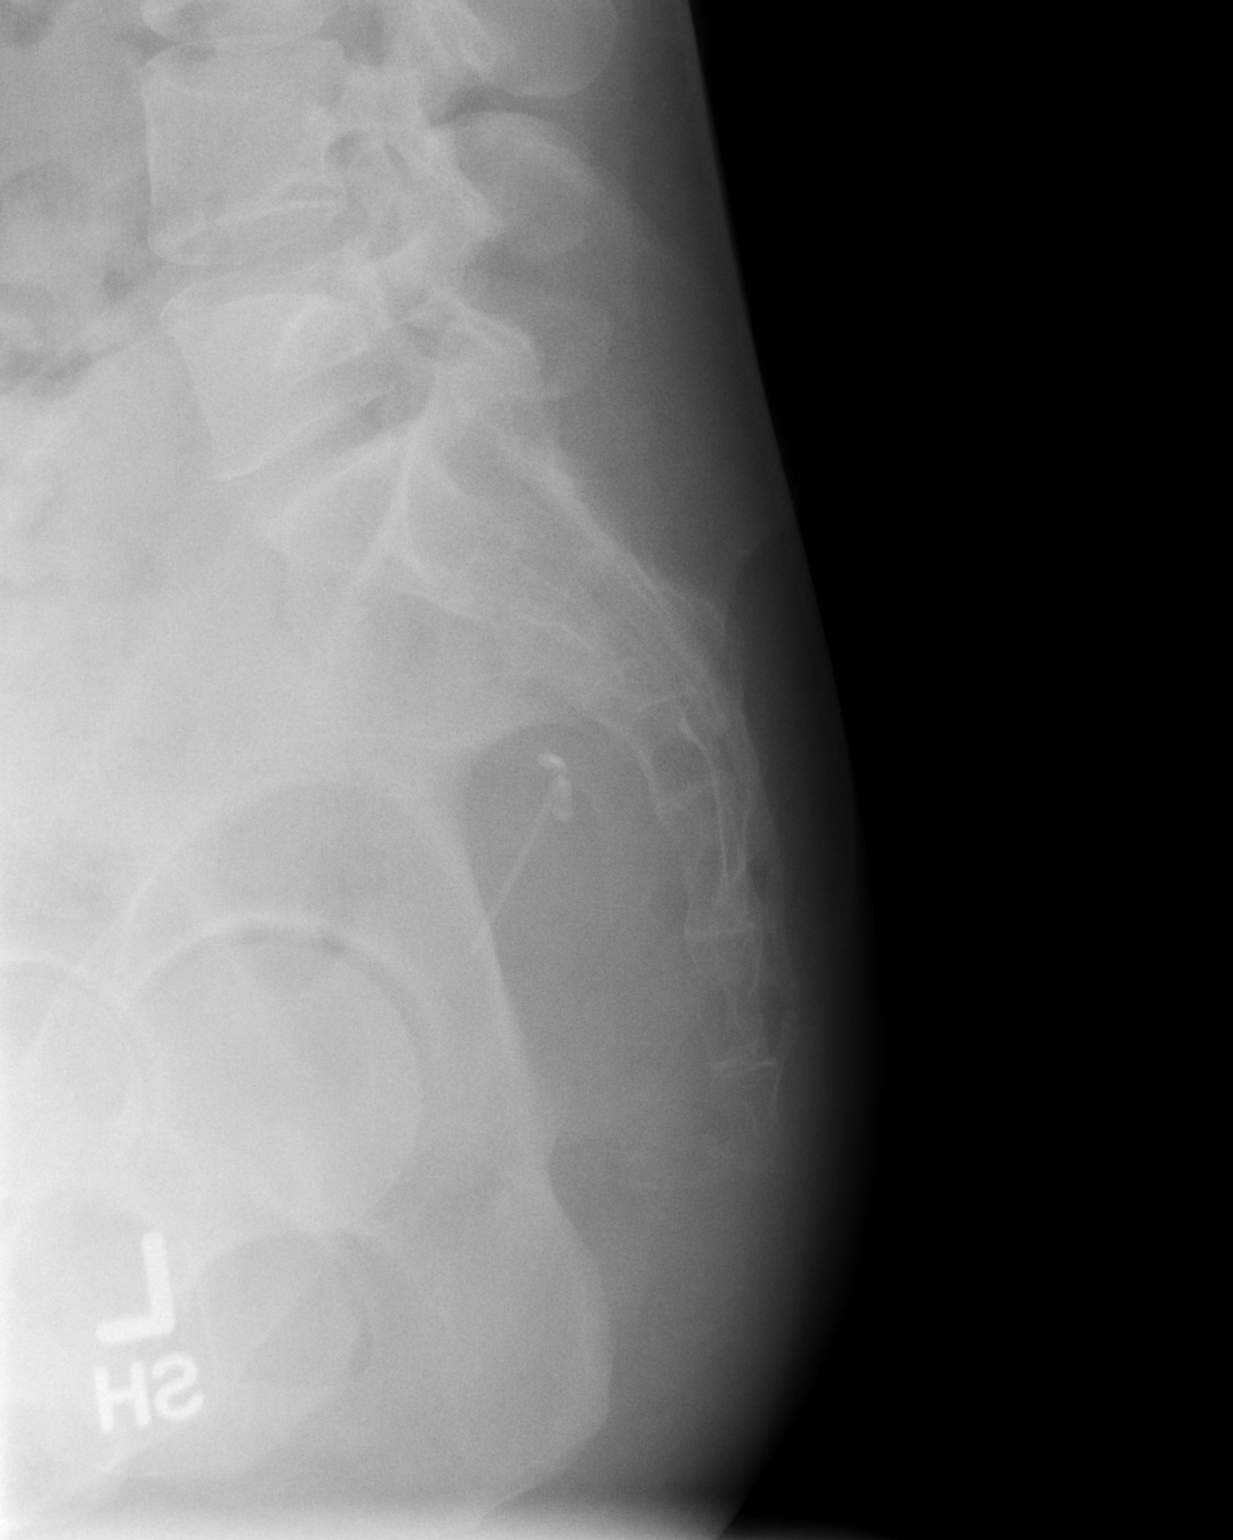

[3 of 3 positions shown; findings below may reference images not displayed]

FINDINGS: IUD is noted in place. Sacral ala are within normal limits. Pelvic
ring is intact. No acute fracture is noted..
IMPRESSION: No acute abnormality noted.
